# Patient Record
Sex: Male | Born: 1995 | Race: White | Hispanic: Yes | Marital: Single | Smoking: Current every day smoker
Health system: Southern US, Community
[De-identification: ages and names within clinical notes are randomized; demographics above are authoritative.]

---
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
## 2017-12-09 ENCOUNTER — Other Ambulatory Visit: Payer: Self-pay

## 2017-12-09 ENCOUNTER — Emergency Department (HOSPITAL_COMMUNITY)
Admission: EM | Admit: 2017-12-09 | Discharge: 2017-12-09 | Disposition: A | Discharge status: Home / Self Care | Payer: Self-pay | Attending: Neurology | Admitting: Neurology

## 2017-12-09 ENCOUNTER — Inpatient Hospital Stay
Admission: AD | Admit: 2017-12-09 | Discharge: 2017-12-12 | DRG: 885 | Disposition: A | Discharge status: Home / Self Care | Payer: No Typology Code available for payment source | Source: Intra-hospital | Attending: Psychiatry | Admitting: Psychiatry

## 2017-12-09 ENCOUNTER — Encounter (HOSPITAL_COMMUNITY): Payer: Self-pay | Admitting: Emergency Medicine

## 2017-12-09 DIAGNOSIS — R45851 Suicidal ideations: Secondary | ICD-10-CM | POA: Insufficient documentation

## 2017-12-09 DIAGNOSIS — F1210 Cannabis abuse, uncomplicated: Secondary | ICD-10-CM | POA: Diagnosis present

## 2017-12-09 DIAGNOSIS — T50902A Poisoning by unspecified drugs, medicaments and biological substances, intentional self-harm, initial encounter: Secondary | ICD-10-CM | POA: Insufficient documentation

## 2017-12-09 DIAGNOSIS — F17210 Nicotine dependence, cigarettes, uncomplicated: Secondary | ICD-10-CM | POA: Diagnosis present

## 2017-12-09 DIAGNOSIS — Z915 Personal history of self-harm: Secondary | ICD-10-CM | POA: Diagnosis not present

## 2017-12-09 DIAGNOSIS — F1520 Other stimulant dependence, uncomplicated: Secondary | ICD-10-CM | POA: Insufficient documentation

## 2017-12-09 DIAGNOSIS — F1420 Cocaine dependence, uncomplicated: Secondary | ICD-10-CM | POA: Diagnosis not present

## 2017-12-09 DIAGNOSIS — F1510 Other stimulant abuse, uncomplicated: Secondary | ICD-10-CM | POA: Diagnosis present

## 2017-12-09 DIAGNOSIS — F329 Major depressive disorder, single episode, unspecified: Secondary | ICD-10-CM | POA: Diagnosis present

## 2017-12-09 DIAGNOSIS — Z813 Family history of other psychoactive substance abuse and dependence: Secondary | ICD-10-CM

## 2017-12-09 DIAGNOSIS — F322 Major depressive disorder, single episode, severe without psychotic features: Secondary | ICD-10-CM | POA: Insufficient documentation

## 2017-12-09 DIAGNOSIS — F332 Major depressive disorder, recurrent severe without psychotic features: Secondary | ICD-10-CM | POA: Diagnosis present

## 2017-12-09 DIAGNOSIS — F1410 Cocaine abuse, uncomplicated: Secondary | ICD-10-CM | POA: Diagnosis present

## 2017-12-09 LAB — COMPREHENSIVE METABOLIC PANEL
ALBUMIN: 4.6 g/dL (ref 3.5–5.0)
ALK PHOS: 72 U/L (ref 38–126)
ALT: 17 U/L (ref 17–63)
AST: 16 U/L (ref 15–41)
Anion gap: 7 (ref 5–15)
BILIRUBIN TOTAL: 1.9 mg/dL — AB (ref 0.3–1.2)
BUN: 10 mg/dL (ref 6–20)
CO2: 28 mmol/L (ref 22–32)
Calcium: 9.9 mg/dL (ref 8.9–10.3)
Chloride: 106 mmol/L (ref 101–111)
Creatinine, Ser: 1.13 mg/dL (ref 0.61–1.24)
GFR calc Af Amer: >60 mL/min (ref >60)
GFR calc non Af Amer: >60 mL/min (ref >60)
GLUCOSE: 91 mg/dL (ref 65–99)
POTASSIUM: 3.3 mmol/L — AB (ref 3.5–5.1)
SODIUM: 141 mmol/L (ref 135–145)
Total Protein: 7.9 g/dL (ref 6.5–8.1)

## 2017-12-09 LAB — RAPID URINE DRUG SCREEN, HOSP PERFORMED
Amphetamines: POSITIVE — AB
BENZODIAZEPINES: NOT DETECTED
Barbiturates: NOT DETECTED
Cocaine: POSITIVE — AB
Opiates: NOT DETECTED
TETRAHYDROCANNABINOL: NOT DETECTED

## 2017-12-09 LAB — CBC WITH DIFFERENTIAL/PLATELET
BASOS ABS: 0 10*3/uL (ref 0.0–0.1)
Basophils Relative: 0 %
EOS PCT: 3 %
Eosinophils Absolute: 0.3 10*3/uL (ref 0.0–0.7)
HEMATOCRIT: 45.6 % (ref 39.0–52.0)
Hemoglobin: 15.8 g/dL (ref 13.0–17.0)
LYMPHS PCT: 32 %
Lymphs Abs: 3 10*3/uL (ref 0.7–4.0)
MCH: 30.6 pg (ref 26.0–34.0)
MCHC: 34.6 g/dL (ref 30.0–36.0)
MCV: 88.2 fL (ref 78.0–100.0)
MONO ABS: 0.8 10*3/uL (ref 0.1–1.0)
MONOS PCT: 8 %
NEUTROS ABS: 5.4 10*3/uL (ref 1.7–7.7)
Neutrophils Relative %: 57 %
PLATELETS: 262 10*3/uL (ref 150–400)
RBC: 5.17 MIL/uL (ref 4.22–5.81)
RDW: 12.4 % (ref 11.5–15.5)
WBC: 9.5 10*3/uL (ref 4.0–10.5)

## 2017-12-09 LAB — URINALYSIS, ROUTINE W REFLEX MICROSCOPIC
Bilirubin Urine: NEGATIVE
GLUCOSE, UA: NEGATIVE mg/dL
HGB URINE DIPSTICK: NEGATIVE
Ketones, ur: NEGATIVE mg/dL
LEUKOCYTES UA: NEGATIVE
Nitrite: NEGATIVE
PH: 6 (ref 5.0–8.0)
PROTEIN: NEGATIVE mg/dL
SPECIFIC GRAVITY, URINE: 1.019 (ref 1.005–1.030)

## 2017-12-09 LAB — ACETAMINOPHEN LEVEL: Acetaminophen (Tylenol), Serum: <10 ug/mL — ABNORMAL LOW (ref 10–30)

## 2017-12-09 LAB — ETHANOL

## 2017-12-09 LAB — SALICYLATE LEVEL: Salicylate Lvl: <7 mg/dL (ref 2.8–30.0)

## 2017-12-09 MED ORDER — ALUM & MAG HYDROXIDE-SIMETH 200-200-20 MG/5ML PO SUSP: 30.0000 mL | ORAL | Status: DC | PRN

## 2017-12-09 MED ORDER — MAGNESIUM HYDROXIDE 400 MG/5ML PO SUSP: 30.0000 mL | Freq: Every day | ORAL | Status: DC | PRN

## 2017-12-09 MED ORDER — TRAZODONE HCL 50 MG PO TABS: 50.0000 mg | ORAL_TABLET | Freq: Every evening | ORAL | Status: DC | PRN

## 2017-12-09 MED ORDER — ACETAMINOPHEN 325 MG PO TABS: 650.0000 mg | ORAL_TABLET | Freq: Four times a day (QID) | ORAL | Status: DC | PRN

## 2017-12-09 MED ORDER — SODIUM CHLORIDE 0.9 % IV BOLUS
1000.0000 mL | Freq: Once | INTRAVENOUS | Status: AC
  Administered 2017-12-09: 1000 mL via INTRAVENOUS

## 2017-12-09 NOTE — ED Notes (Signed)
Patient is resting comfortably. 

## 2017-12-09 NOTE — ED Notes (Signed)
Called [facility] to cancel transport at this time.

## 2017-12-09 NOTE — ED Notes (Signed)
Called [facility] back for transfer to [place].
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
## 2017-12-09 NOTE — [facility] Suicide Risk Assessment (Signed)
[facility]   Nursing information obtained from:  Patient Demographic factors:  Male, Adolescent or young adult Current Mental Status:  NA Loss Factors:  NA Historical Factors:  NA Risk Reduction Factors:  Employed, Positive social support, Positive therapeutic relationship, Sense of responsibility to family  Total Time spent with patient: 1 hour Principal Problem: MDD (major depressive disorder), recurrent episode, severe ([facility]) Diagnosis:   Patient Active Problem List   Diagnosis Date Noted  . MDD (major depressive disorder) [F32.9] 12/09/2017  . MDD (major depressive disorder), recurrent episode, severe ([facility]) [F33.2] 12/09/2017  . Cocaine use disorder, moderate, dependence ([facility]) [F14.20] 12/09/2017  . 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 ALLERGIC RHINITIS, CHRONIC [J30.9] 03/08/2007   Subjective Data:  See H&P  Continued Clinical Symptoms:  Alcohol Use Disorder Identification Test Final Score (AUDIT): 4 The "Alcohol Use Disorders Identification Test", Guidelines for Use in Primary Care, Second Edition.  World Science writerHealth Organization [facility](WHO). Score between 0-7:  no or low risk or alcohol related problems. Score between 8-15:  moderate risk of alcohol related problems. Score between 16-19:  high risk of alcohol related problems. Score 20 or above:  warrants further diagnostic evaluation for alcohol dependence and treatment.   CLINICAL FACTORS:   Bipolar Disorder:   Bipolar II untreated   Musculoskeletal: Strength & Muscle Tone: within normal limits Gait & Station: normal Patient leans: N/A  Psychiatric Specialty Exam: See H&P   COGNITIVE FEATURES THAT CONTRIBUTE TO RISK:  Closed-mindedness    SUICIDE RISK:   Mild:  Suicidal ideation of limited frequency, intensity, duration, and specificity.  There are no identifiable plans, no associated intent, mild dysphoria and related symptoms, good self-control (both objective and  subjective assessment), few other risk factors, and identifiable protective factors, including available and accessible social support.  PLAN OF CARE: See H&P  I certify that inpatient services furnished can reasonably be expected to improve the patient's condition.   Chabely Norby, MD 12/09/2017, 6:40 PM

## 2017-12-09 NOTE — ED Triage Notes (Signed)
Pt in by [facility] from local [facility] after taking approx 32 Unisom tabs in an admitted suicide attempt per law enforcement.

## 2017-12-09 NOTE — ED Notes (Signed)
Vital signs stable. 

## 2017-12-09 NOTE — ED Notes (Signed)
TTS in room.  

## 2017-12-09 NOTE — Plan of Care (Signed)
New admission , has not discussed goals   Problem: Education: Goal: Knowledge of [facility] Education information/materials will improve Outcome: Not Progressing Goal: Emotional status will improve Outcome: Not Progressing Goal: Mental status will improve Outcome: Not Progressing Goal: Verbalization of understanding the information provided will improve Outcome: Not Progressing   Problem: Coping: Goal: Coping ability will improve Outcome: Not Progressing Goal: Will verbalize feelings Outcome: Not Progressing   Problem: Medication: Goal: Compliance with prescribed medication regimen will improve Outcome: Not Progressing   Problem: Self-Concept: Goal: Ability to disclose and discuss suicidal ideas will improve Outcome: Not Progressing Goal: Will verbalize positive feelings about self Outcome: Not Progressing

## 2017-12-09 NOTE — ED Triage Notes (Signed)
Poison Control contacted with the following recommendations: Cardiac monitoring, ekg, 4 hour tylenol, etoh, aspirin levels, electrolytes, magnesium levels now. Watch for prolonged qtc, drowsiness, possible anticholinergic effects such as tachycardia, hypertension, agitation, possible hallucinations.   Monitor pt for at least 6 hours post ingestion or back to baseline., hydrate, benzo's as needed

## 2017-12-09 NOTE — ED Notes (Signed)
Pt given breakfast tray

## 2017-12-09 NOTE — BH Assessment (Addendum)
Tele Assessment Note   Patient Name: Logan Christensen MRN: [id number] Referring Physician: Geoffery Lyons, MD Location of Patient: [facility] Location of Provider: Behavioral Health TTS Department  Logan Christensen is an 22 y.o. male who presents to the ED voluntarily. Pt reports to this writer that he intentionally ingested approx 32 Unisom tabs in a suicide attempt. TTS asked the pt to identify the stressors leading to his suicide attempt and he refused. Pt stated "I just don't want to to talk about it, just a lot of stuff." Pt labs are positive for cocaine and amphetamines. Pt admits to using the substance and states he also uses marijuana. Pt states this is the first time he has ever actually attempted suicide. Pt does not have a current provider.   Pt states he lives with his foster parents. Pt is falling asleep throughout the assessment. TTS has to call the pt's name multiple times in order to keep him engaged in the assessment.   TTS consulted with Donell Sievert, PA who recommends inpt treatment. Herbert Seta, RN and EDP Geoffery Lyons, MD have been advised of the disposition.  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  Family History: No family history on file.  Social History:  has no tobacco, alcohol, and drug history on file.  Additional Social History:  Alcohol / Drug Use Pain Medications: See MAR Prescriptions: See MAR Over the Counter: See MAR History of alcohol / drug use?: Yes Substance #1 Name of Substance 1: Cannabis 1 - Age of First Use: 20 1 - Amount (size/oz): varies 1 - Frequency: occasional 1 - Duration: ongoing 1 - Last Use / Amount: pt says 3 weeks ago Substance #2 Name of Substance 2: Cocaine 2 - Age of First Use: 20 2 - Amount (size/oz): varies 2 - Frequency: occasional 2 - Duration: ongoing 2 - Last  Use / Amount: pt says 3 weeks ago, labs positive on arrival to ED Substance #3 Name of Substance 3: Amphetamines 3 - Age of First Use: 20 3 - Amount (size/oz): varies 3 - Frequency: occasional 3 - Duration: ongoing 3 - Last Use / Amount: pt says 3 weeks ago, labs positive on arrival to ED  CIWA: CIWA-Ar BP: 118/71 Pulse Rate: 70 COWS:    Allergies: Allergies not on file  Home Medications:  (Not in a hospital admission)  OB/GYN Status:  No LMP for male patient.  General Assessment Data Location of Assessment: [facility] TTS Assessment: In system Is this a Tele or Face-to-Face Assessment?: Tele Assessment Is this an Initial Assessment or a Re-assessment for this encounter?: Initial Assessment Marital status: Single Is patient pregnant?: No Pregnancy Status: No Living Arrangements: Parent Can pt return to current living arrangement?: Yes Admission Status: Voluntary Is patient capable of signing voluntary admission?: Yes Referral Source: Self/Family/Friend Insurance type: none     Crisis Care Plan Living Arrangements: Parent Name of Psychiatrist: none at present Name of Therapist: none at present   Education Status Is patient currently in school?: No Is the patient employed, unemployed or receiving disability?: Employed  Risk to self with the past 6 months Suicidal Ideation: Yes-Currently Present Has patient been a risk to self within the past 6 months prior to admission? : Yes Suicidal Intent: Yes-Currently Present Has patient had any suicidal intent within the past 6 months prior to admission? : Yes Is patient  at risk for suicide?: Yes Suicidal Plan?: Yes-Currently Present Has patient had any suicidal plan within the past 6 months prior to admission? : Yes Specify Current Suicidal Plan:  approx 32 Unisom tabs in an admitted suicide attempt  Access to Means: Yes Specify Access to Suicidal Means: pt has access to medications  What has been your use of drugs/alcohol  within the last 12 months?: admits to cocaine, cannabis, and meth use  Previous Attempts/Gestures: No Triggers for Past Attempts: None known Intentional Self Injurious Behavior: None Family Suicide History: No Recent stressful life event(s): Other (Comment)(pt refuses to disclose) Persecutory voices/beliefs?: No Depression: Yes Depression Symptoms: Feeling worthless/self pity, Fatigue, Despondent Substance abuse history and/or treatment for substance abuse?: Yes Suicide prevention information given to non-admitted patients: Not applicable  Risk to Others within the past 6 months Homicidal Ideation: No Does patient have any lifetime risk of violence toward others beyond the six months prior to admission? : No Thoughts of Harm to Others: No Current Homicidal Intent: No Current Homicidal Plan: No Access to Homicidal Means: No History of harm to others?: No Assessment of Violence: None Noted Does patient have access to weapons?: No Criminal Charges Pending?: No Does patient have a court date: No Is patient on probation?: No  Psychosis Hallucinations: None noted Delusions: None noted  Mental Status Report Appearance/Hygiene: In scrubs Eye Contact: Poor Motor Activity: Freedom of movement Speech: Slurred, Slow Level of Consciousness: Drowsy Mood: Depressed, Despair Affect: Depressed, Flat Anxiety Level: None Thought Processes: Coherent, Relevant Judgement: Impaired Orientation: Place, Person, Time, Appropriate for developmental age, Situation Obsessive Compulsive Thoughts/Behaviors: None  Cognitive Functioning Concentration: Normal Memory: Remote Intact, Recent Intact Is patient IDD: No Is patient DD?: No Insight: Poor Impulse Control: Poor Appetite: Good Have you had any weight changes? : No Change Sleep: No Change Total Hours of Sleep: 8 Vegetative Symptoms: None  ADLScreening [facility] Assessment Services) Patient's cognitive ability adequate to safely complete daily  activities?: Yes Patient able to express need for assistance with ADLs?: Yes Independently performs ADLs?: Yes (appropriate for developmental age)  Prior Inpatient Therapy Prior Inpatient Therapy: No  Prior Outpatient Therapy Prior Outpatient Therapy: Yes Prior Therapy Dates: 2010 Prior Therapy Facilty/Provider(s): [facility](Rodenbough, Aram CandelaJohn R, PsyD ) Reason for Treatment: MED MANAGEMENT  Does patient have an ACCT team?: No Does patient have Intensive In-House Services?  : No Does patient have [facility] services? : No Does patient have P4CC services?: No  ADL Screening (condition at time of admission) Patient's cognitive ability adequate to safely complete daily activities?: Yes Is the patient deaf or have difficulty hearing?: No Does the patient have difficulty seeing, even when wearing glasses/contacts?: No Does the patient have difficulty concentrating, remembering, or making decisions?: No Patient able to express need for assistance with ADLs?: Yes Does the patient have difficulty dressing or bathing?: No Independently performs ADLs?: Yes (appropriate for developmental age) Does the patient have difficulty walking or climbing stairs?: No Weakness of Legs: None Weakness of Arms/Hands: None  Home Assistive Devices/Equipment Home Assistive Devices/Equipment: None    Abuse/Neglect Assessment (Assessment to be complete while patient is alone) Abuse/Neglect Assessment Can Be Completed: Yes Physical Abuse: Denies Verbal Abuse: Denies Sexual Abuse: Denies Exploitation of patient/patient's resources: Denies Self-Neglect: Denies     Merchant navy officerAdvance Directives (For Healthcare) Does Patient Have a Medical Advance Directive?: No Would patient like information on creating a medical advance directive?: No - Patient declined    Additional Information 1:1 In Past 12 Months?: No CIRT  Risk: No Elopement Risk: No Does patient have medical  clearance?: Yes     Disposition: TTS consulted with Donell Sievert, PA who recommends inpt treatment. Herbert Seta, RN and EDP Geoffery Lyons, MD have been advised of the disposition. Disposition Initial Assessment Completed for this Encounter: Yes Disposition of Patient: Admit Type of inpatient treatment program: Adult(per Donell Sievert, PA) Patient refused recommended treatment: No  This service was provided via telemedicine using a 2-way, interactive audio and video technology.  Names of all persons participating in this telemedicine service and their role in this encounter. Name: Logan Christensen Viann Fish Role: Patient  Name: Princess Bruins Role: Patient           Karolee Ohs 12/09/2017 6:21 AM

## 2017-12-09 NOTE — Progress Notes (Signed)
Admission Note:  D: Pt appeared depressed  With  a flat affect.  Pt  denies SI / AVH at this time. 22 year old male patient in under the service of Dr. He at this time to be transferred to Dr. Johnella MoloneyMcNew on Monday . patient went into [employer]  Brought 30 unisom tablets  Took them  When he began to feel bad , he became afraid [facility] [employer] and [place] [facility].   Speech as fast and  Pressured .  Writer had difficult time  Following  Patient, not a good historian.  Pt is redirectable and cooperative with assessment.      A: Pt admitted to unit per protocol, skin assessment and search done and no contraband found with MHT  .  Pt  educated on therapeutic milieu rules. Pt was introduced to milieu by nursing staff.    R: Pt was receptive to education about the milieu .  15 min safety checks started. Clinical research associatewriter offered support

## 2017-12-09 NOTE — BHH Group Notes (Signed)
B[facility] Group Notes:  (Nursing/MHT/Case Management/Adjunct)  Date:  12/09/2017  Time:  9:08 PM  Type of Therapy:  Group Therapy  Participation Level:  Did Not Attend    Logan NeighborsKeith D Christensen Sine 12/09/2017, 9:08 PM

## 2017-12-09 NOTE — Progress Notes (Signed)
TTS consulted with Donell SievertSpencer Simon, PA who recommends inpt treatment. Herbert SetaHeather, RN and EDP Geoffery Lyonselo, Douglas, MD have been advised of the disposition. Pt is currently VOL. If pt attempts to elope, [facility] recommends [facility] due to the suicide attempt.  Princess BruinsAquicha Brenae Lasecki, MSW, LCSW Therapeutic Triage Specialist  [phone]

## 2017-12-09 NOTE — ED Notes (Signed)
Report given to Ivette LoyalGwynn, RN at [facility].

## 2017-12-09 NOTE — ED Provider Notes (Signed)
[facility] EMERGENCY DEPARTMENT Provider Note   CSN: [id number] Arrival date & time: 12/09/17  0019     History   Chief Complaint Chief Complaint  Patient presents with  . V70.1    overdose    HPI Logan Christensen is a 22 y.o. male.  Patient is a 22 year old male with no significant past medical history.  He was brought by EMS this evening after taking roughly 30 Unisom tablets this evening.  From what I am told, he purchased these medications from [place], then went to his car and took them.  When he began to feel poorly, he returned to the store and had them call 911.  He told EMS he took these medications to try to harm himself.  He is somewhat difficult to get a history from as he does appear drowsy and somewhat confused.  The history is provided by the patient.    No past medical history on file.  Patient Active Problem List   Diagnosis Date Noted  . ALLERGIC RHINITIS, CHRONIC 03/08/2007          Home Medications    Prior to Admission medications   Not on File    Family History No family history on file.  Social History Social History   Tobacco Use  . Smoking status: Not on file  Substance Use Topics  . Alcohol use: Not on file  . Drug use: Not on file     Allergies   Patient has no allergy information on record.   Review of Systems Review of Systems  All other systems reviewed and are negative.    Physical Exam Updated Vital Signs BP 100/74   Pulse 91   Temp 98 F (36.7 C)   Ht 5\' 11"  (1.803 m)   Wt 71.6 kg (157 lb 14.4 oz)   SpO2 100%   BMI 22.02 kg/m   Physical Exam  Constitutional: He is oriented to person, place, and time. He appears well-developed and well-nourished. No distress.  HENT:  Head: Normocephalic and atraumatic.  Mouth/Throat: Oropharynx is clear and moist.  Neck: Normal range of motion. Neck supple.  Cardiovascular: Normal rate and regular rhythm. Exam reveals no friction rub.  No murmur  heard. Pulmonary/Chest: Effort normal and breath sounds normal. No respiratory distress. He has no wheezes. He has no rales.  Abdominal: Soft. Bowel sounds are normal. He exhibits no distension. There is no tenderness.  Musculoskeletal: Normal range of motion. He exhibits no edema.  Neurological: He is alert and oriented to person, place, and time. No cranial nerve deficit. He exhibits normal muscle tone. Coordination normal.  Skin: Skin is warm and dry. He is not diaphoretic.  Nursing note and vitals reviewed.    ED Treatments / Results  Labs (all labs ordered are listed, but only abnormal results are displayed) Labs Reviewed  COMPREHENSIVE METABOLIC PANEL  CBC WITH DIFFERENTIAL/PLATELET  ACETAMINOPHEN LEVEL  SALICYLATE LEVEL  ETHANOL  URINALYSIS, ROUTINE W REFLEX MICROSCOPIC  RAPID URINE DRUG SCREEN, HOSP PERFORMED    ED ECG REPORT   Date: 12/09/2017  Rate: 77  Rhythm: normal sinus rhythm  QRS Axis: normal  Intervals: normal  ST/T Wave abnormalities: normal  Conduction Disutrbances:none  Narrative Interpretation:   Old EKG Reviewed: none available  I have personally reviewed the EKG tracing and agree with the computerized printout as noted.   Radiology No results found.  Procedures Procedures (including critical care time)  Medications Ordered in ED Medications  sodium chloride 0.9 %  bolus 1,000 mL (has no administration in time range)     Initial Impression / Assessment and Plan / ED Course  I have reviewed the triage vital signs and the nursing notes.  Pertinent labs & imaging results that were available during my care of the patient were reviewed by me and considered in my medical decision making (see chart for details).  Patient is a 22 year old male brought after overdosing on Unisom.  He states that he did this as an attempt to harm himself.  Poison control was consulted and recommendations followed.  He was hydrated and laboratory studies obtained.  EKG  shows no QT prolongation or other abnormality.  Tylenol level at 4 hours was undetectable.  He has been observed for 6 hours and is medically cleared.  He is also been evaluated by TTS who feels as though inpatient treatment is appropriate.  Bed assignment is pending.  Final Clinical Impressions(s) / ED Diagnoses   Final diagnoses:  None    ED Discharge Orders    None       Geoffery Lyonselo, Burhan Barham, MD 12/09/17 [phone]

## 2017-12-09 NOTE — BHH Counselor (Addendum)
Patient has been accepted to [facility].  Accepting physician is Dr. He.  Attending Physician will be Dr. Johnella MoloneyMcNew.  Patient has been assigned to room 311-A, by [facility] Charge Nurse [place].  Call report to [phone].  Representative/Transfer Coordinator is Wilmon ArmsShaleta Stevenson (TTS [phone]) Patient pre-admitted by [facility] Patient Access   **Per [facility] BM[facility] Charge RN, pt can be admitted after 7pm due to staffing.  [facility] ER Staff (ED Charge RN) made aware of acceptance.

## 2017-12-09 NOTE — ED Notes (Signed)
Spoke with poison control who stated pt is now medically cleared.

## 2017-12-09 NOTE — Progress Notes (Signed)
Patient meets criteria for inpatient treatment. [facility] CSW faxed referrals to the following facilities for review.  [facility]   TTS will continue to seek bed placement.     Moss McKy-sha Shermeka Rutt, MSW, LCSW-A, LCAS-A 12/09/2017 8:44 AM

## 2017-12-09 NOTE — ED Notes (Addendum)
Called [facility] for transport to [facility].

## 2017-12-10 MED ORDER — NICOTINE 21 MG/24HR TD PT24
21.0000 mg | MEDICATED_PATCH | Freq: Every day | TRANSDERMAL | Status: DC
  Administered 2017-12-10 – 2017-12-11 (×2): 21 mg via TRANSDERMAL
  Filled 2017-12-10 (×3): qty 1

## 2017-12-10 NOTE — BHH Counselor (Signed)
Adult Comprehensive Assessment  Patient ID: Logan EpleyDarrin M Christensen, male   DOB: 08/04/1995, 22 y.o.   MRN: [id number]  Information Source: Information source: Patient  Current Stressors:  Patient states their primary concerns and needs for treatment are:: "I was trying to commit suicide" Patient states their goals for this hospitilization and ongoing recovery are:: "I dont have any goals because I know what I did was a mistake" Educational / Learning stressors: none reported Employment / Job issues: none reported Family Relationships: "complicatedEngineer, petroleum" Financial / Lack of resources (include bankruptcy): emplyoment and [employer] disability Housing / Lack of housing: residing with foster parents Physical health (include injuries & life threatening diseases): none reported Social relationships: "good" Substance abuse: cocaine, Adderall, ETOH Bereavement / Loss: none reported  Living/Environment/Situation:  Living Arrangements: Other relatives(Foster parents) Who else lives in the home?: foster parents and 2 foster kids How long has patient lived in current situation?: "about 1 month" What is atmosphere in current home: Comfortable, Temporary  Family History:  Marital status: Single Are you sexually active?: Yes What is your sexual orientation?: heterosexual Has your sexual activity been affected by drugs, alcohol, medication, or emotional stress?: no Does patient have children?: No  Childhood History:  By whom was/is the patient raised?: Foster parents, Adoptive parents Additional childhood history information: pt reports he lived with his biological mom until he was 89 years old, then his foster parent until he was 11yo, and later on adopted by a different family Description of patient's relationship with caregiver when they were a child: "good" Patient's description of current relationship with people who raised him/her: "good" How were you disciplined when you got in trouble as a child/adolescent?:  "anything imaginable" Does patient have siblings?: Yes Number of Siblings: 4 Description of patient's current relationship with siblings: patient reports he has 3 biological siblings and 1 adoptive siblings, he described their relationship as "good" Did patient suffer any verbal/emotional/physical/sexual abuse as a child?: No Did patient suffer from severe childhood neglect?: No Has patient ever been sexually abused/assaulted/raped as an adolescent or adult?: No Was the patient ever a victim of a crime or a disaster?: No Witnessed domestic violence?: No Has patient been effected by domestic violence as an adult?: No  Education:  Highest grade of school patient has completed: 12th Currently a student?: No Learning disability?: No  Employment/Work Situation:   Employment situation: Employed(pt reports he is employed and receives VA disability) Where is patient currently employed?: [employer] How long has patient been employed?: 1.5 years Why is patient on disability: pt indicated he served three years in [employer] How long has patient been on disability: pt did not provide information Patient's job has been impacted by current illness: No What is the longest time patient has a held a job?: 3.5 years Where was the patient employed at that time?: [employer] Did You Receive Any Psychiatric Treatment/Services While in [employer]?: No Are There Guns or Other Weapons in Your Home?: No  Financial Resources:   Financial resources: Income from employment  Alcohol/Substance Abuse:   What has been your use of drugs/alcohol within the last 12 months?: Cocaine - once per month 1/2 gram or more, Adderall once monthly 3to 4 pills, ETOH- occassionally If attempted suicide, did drugs/alcohol play a role in this?: No Alcohol/Substance Abuse Treatment Hx: Denies past history Has alcohol/substance abuse ever caused legal problems?: No  Social Support System:   Patient's Community Support  System: Good Describe Community Support System: brother, friend "Eddie"  Type of faith/religion: "I don't have one" How does patient's faith help to cope with current illness?: n/a  Leisure/Recreation:   Leisure and Hobbies: hang out with friends  Strengths/Needs:   What is the patient's perception of their strengths?: "talking ability" Patient states they can use these personal strengths during their treatment to contribute to their recovery: "I am not sure" Patient states these barriers may affect/interfere with their treatment: "I dont see any" Patient states these barriers may affect their return to the community: "I don't see any"   Discharge Plan:   Currently receiving community mental health services: No Patient states concerns and preferences for aftercare planning are: Pt reports that he receives medical services at the [facility] in [place] and would go there for any mental health services as well Patient states they will know when they are safe and ready for discharge when: "Basically I'm not going to hurt myself or hang out with the wrong crowd" Does patient have access to transportation?: Yes(foster parents will pcik him up) Does patient have financial barriers related to discharge medications?: No Will patient be returning to same living situation after discharge?: Yes(pt will return to foster parent's home)  Summary/Recommendations:   Summary and Recommendations (to be completed by the evaluator): Patient is a 22 year old male admitted involuntarily due to a suicide attempt and diagnosed with Major Depressive Disorder and Substance Abuse Disorder. Patient has a childhood diagnosis of bipolar disorder, but no previous treatment, presented in [facility] ER after he admitted taking 32 Unison tabs as a suicidal attempt. On arrival to the ER, he is also positive for other substances. Patient will benefit from crisis stabilization, medication evaluation, group therapy and psychoeducation.  In addition to case management for discharge planning. At discharge it is recommended that patient adhere to the established discharge plan and continue treatment.   Lyndell Gillyard  CUEBAS-COLON. 12/10/2017

## 2017-12-10 NOTE — Progress Notes (Signed)
D- Patient alert and oriented. Patient presents in a pleasant mood on assessment stating that he slept "good" last night with no complaints at this time. Patient denies SI, HI, AVH, and pain at this time. Patient also denies any signs/symptom of depression/anxiety at this time. Patient's goal for today is "trying to stay awake more today".  A- Support and encouragement provided.  Routine safety checks conducted every 15 minutes.  Patient informed to notify staff with problems or concerns.  R- Patient contracts for safety at this time. Patient compliant with treatment plan. Patient receptive, calm, and cooperative. Patient interacts well with others on the unit.  Patient remains safe at this time.

## 2017-12-10 NOTE — BHH Suicide Risk Assessment (Signed)
B[facility] INPATIENT:  Family/Significant Other Suicide Prevention Education  Suicide Prevention Education:  Patient Refusal for Family/Significant Other Suicide Prevention Education: The patient Logan Christensen has refused to provide written consent for family/significant other to be provided Family/Significant Other Suicide Prevention Education during admission and/or prior to discharge.  Physician notified.  Hiroto Saltzman  CUEBAS-COLON 12/10/2017, 4:52 PM

## 2017-12-10 NOTE — Progress Notes (Signed)
[facility] MD Progress Note  12/10/2017 12:55 PM Logan Christensen  MRN:  [id number] Subjective:  Logan Christensen is calm, cooperative, very pleasant this morning. Logan Christensen speech is much clearer as well.  Logan Christensen reports feeing better and had a good night sleep. Denied SI. No AVH.   While Logan Christensen admitted that Logan Christensen is impulsive at times, but denied depression and declined medication.  Logan Christensen is agreeable to outpatient Mental health care and substance abuse treatment.   Principal Problem: MDD (major depressive disorder), recurrent episode, severe ([place]) Diagnosis:   Patient Active Problem List   Diagnosis Date Noted  . MDD (major depressive disorder) [F32.9] 12/09/2017  . MDD (major depressive disorder), recurrent episode, severe ([place]) [F33.2] 12/09/2017  . Cocaine use disorder, moderate, dependence ([place]) [F14.20] 12/09/2017  . 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 ALLERGIC RHINITIS, CHRONIC [J30.9] 03/08/2007   Total Time spent with patient: 20 minutes  Past Psychiatric History: Hx of bipolar, dx at age 53. Pt stated that Logan Christensen was just angry.  Past Medical History: History reviewed. No pertinent past medical history. History reviewed. No pertinent surgical history. Family History: History reviewed. No pertinent family history. Family Psychiatric  History:  unknown Social History:  Social History   Substance and Sexual Activity  Alcohol Use Not on file     Social History   Substance and Sexual Activity  Drug Use Not on file    Social History   Socioeconomic History  . Marital status: Single    Spouse name: Not on file  . Number of children: Not on file  . Years of education: Not on file  . Highest education level: Not on file  Occupational History  . Not on file  Social Needs  . Financial resource strain: Not on file  . Food insecurity:    Worry: Not on file    Inability: Not on file  . Transportation needs:    Medical: Not on file    Non-medical: Not on file  Tobacco Use  . Smoking  status: Current Every Day Smoker    Packs/day: 1.00  . Smokeless tobacco: Current User  Substance and Sexual Activity  . Alcohol use: Not on file  . Drug use: Not on file  . Sexual activity: Not on file  Lifestyle  . Physical activity:    Days per week: Not on file    Minutes per session: Not on file  . Stress: Not on file  Relationships  . Social connections:    Talks on phone: Not on file    Gets together: Not on file    Attends religious service: Not on file    Active member of club or organization: Not on file    Attends meetings of clubs or organizations: Not on file    Relationship status: Not on file  Other Topics Concern  . Not on file  Social History Narrative  . Not on file   Additional Social History: biological mom was a drug addict, but no details available.     Sleep: Good  Appetite:  Good  Current Medications: Current Facility-Administered Medications  Medication Dose Route Frequency Provider Last Rate Last Dose  . acetaminophen (TYLENOL) tablet 650 mg  650 mg Oral Q6H PRN Logan Noon, MD      . alum & mag hydroxide-simeth (MAALOX/MYLANTA) 200-200-20 MG/5ML suspension 30 mL  30 mL Oral Q4H PRN Logan Pop, MD      . magnesium hydroxide (MILK OF MAGNESIA) suspension 30 mL  30  mL Oral Daily PRN Logan Adamson, MD      . traZODone (DESYREL) tablet 50 mg  50 mg Oral QHS PRN Logan Pearse, MD        Lab Results:  Results for orders placed or performed during the hospital encounter of 12/09/17 (from the past 48 hour(s))  Urinalysis, Routine w reflex microscopic     Status: None   Collection Time: 12/09/17 12:44 AM  Result Value Ref Range   Color, Urine YELLOW YELLOW   APPearance CLEAR CLEAR   Specific Gravity, Urine 1.019 1.005 - 1.030   pH 6.0 5.0 - 8.0   Glucose, UA NEGATIVE NEGATIVE mg/dL   Hgb urine dipstick NEGATIVE NEGATIVE   Bilirubin Urine NEGATIVE NEGATIVE   Ketones, ur NEGATIVE NEGATIVE mg/dL   Protein, ur NEGATIVE NEGATIVE mg/dL   Nitrite NEGATIVE NEGATIVE    Leukocytes, UA NEGATIVE NEGATIVE    Comment: Performed at [facility], [address], [place], [place] [address]  Urine rapid drug screen (hosp performed)     Status: Abnormal   Collection Time: 12/09/17 12:44 AM  Result Value Ref Range   Opiates NONE DETECTED NONE DETECTED   Cocaine POSITIVE (A) NONE DETECTED   Benzodiazepines NONE DETECTED NONE DETECTED   Amphetamines POSITIVE (A) NONE DETECTED   Tetrahydrocannabinol NONE DETECTED NONE DETECTED   Barbiturates NONE DETECTED NONE DETECTED    Comment: (NOTE) DRUG SCREEN FOR MEDICAL PURPOSES ONLY.  IF CONFIRMATION IS NEEDED FOR ANY PURPOSE, NOTIFY LAB WITHIN 5 DAYS. LOWEST DETECTABLE LIMITS FOR URINE DRUG SCREEN Drug Class                     Cutoff (ng/mL) Amphetamine and metabolites    1000 Barbiturate and metabolites    200 Benzodiazepine                 706 Tricyclics and metabolites     300 Opiates and metabolites        300 Cocaine and metabolites        300 THC                            50 Performed at [facility], [address], [place], [place] [address]   Comprehensive metabolic panel     Status: Abnormal   Collection Time: 12/09/17 12:48 AM  Result Value Ref Range   Sodium 141 135 - 145 mmol/L   Potassium 3.3 (L) 3.5 - 5.1 mmol/L   Chloride 106 101 - 111 mmol/L   CO2 28 22 - 32 mmol/L   Glucose, Bld 91 65 - 99 mg/dL   BUN 10 6 - 20 mg/dL   Creatinine, Ser 1.13 0.61 - 1.24 mg/dL   Calcium 9.9 8.9 - 10.3 mg/dL   Total Protein 7.9 6.5 - 8.1 g/dL   Albumin 4.6 3.5 - 5.0 g/dL   AST 16 15 - 41 U/L   ALT 17 17 - 63 U/L   Alkaline Phosphatase 72 38 - 126 U/L   Total Bilirubin 1.9 (H) 0.3 - 1.2 mg/dL   GFR calc non Af Amer >60 >60 mL/min   GFR calc Af Amer >60 >60 mL/min    Comment: (NOTE) The eGFR has been calculated using the CKD EPI equation. This calculation has not been validated in all clinical situations. eGFR's persistently <60 mL/min signify possible Chronic Kidney Disease.    Anion gap 7 5 - 15     Comment: Performed  at [facility], [address] [place], [place] [address]  CBC with Differential     Status: None   Collection Time: 12/09/17 12:48 AM  Result Value Ref Range   WBC 9.5 4.0 - 10.5 K/uL   RBC 5.17 4.22 - 5.81 MIL/uL   Hemoglobin 15.8 13.0 - 17.0 g/dL   HCT 45.6 39.0 - 52.0 %   MCV 88.2 78.0 - 100.0 fL   MCH 30.6 26.0 - 34.0 pg   MCHC 34.6 30.0 - 36.0 g/dL   RDW 12.4 11.5 - 15.5 %   Platelets 262 150 - 400 K/uL   Neutrophils Relative % 57 %   Neutro Abs 5.4 1.7 - 7.7 K/uL   Lymphocytes Relative 32 %   Lymphs Abs 3.0 0.7 - 4.0 K/uL   Monocytes Relative 8 %   Monocytes Absolute 0.8 0.1 - 1.0 K/uL   Eosinophils Relative 3 %   Eosinophils Absolute 0.3 0.0 - 0.7 K/uL   Basophils Relative 0 %   Basophils Absolute 0.0 0.0 - 0.1 K/uL    Comment: Performed at [facility], [address] [place], [place] [address]  Acetaminophen level     Status: Abnormal   Collection Time: 12/09/17 12:48 AM  Result Value Ref Range   Acetaminophen (Tylenol), Serum <10 (L) 10 - 30 ug/mL    Comment: (NOTE) Therapeutic concentrations vary significantly. A range of 10-30 ug/mL  may be an effective concentration for many patients. However, some  are best treated at concentrations outside of this range. Acetaminophen concentrations >150 ug/mL at 4 hours after ingestion  and >50 ug/mL at 12 hours after ingestion are often associated with  toxic reactions. Performed at [facility], [address] [place], [place] [address]   Salicylate level     Status: None   Collection Time: 12/09/17 12:48 AM  Result Value Ref Range   Salicylate Lvl <8.5 2.8 - 30.0 mg/dL    Comment: Performed at [facility], [address], [place], [place] [address]  Ethanol     Status: None   Collection Time: 12/09/17 12:48 AM  Result Value Ref Range   Alcohol, Ethyl (B) <10 <10 mg/dL    Comment: (NOTE) Lowest detectable limit for serum alcohol is 10 mg/dL. For medical purposes only. Performed at [facility], [address], [place], [place] [address]   Acetaminophen level     Status: Abnormal   Collection Time: 12/09/17  3:07 AM  Result Value Ref Range   Acetaminophen (Tylenol), Serum <10 (L) 10 - 30 ug/mL    Comment: (NOTE) Therapeutic concentrations vary significantly. A range of 10-30 ug/mL  may be an effective concentration for many patients. However, some  are best treated at concentrations outside of this range. Acetaminophen concentrations >150 ug/mL at 4 hours after ingestion  and >50 ug/mL at 12 hours after ingestion are often associated with  toxic reactions. Performed at [facility], [address], [place], [place] [address]     Blood Alcohol level:  Lab Results  Component Value Date   ETH <10 67/20/9470    Metabolic Disorder Labs: No results found for: HGBA1C, MPG No results found for: PROLACTIN No results found for: CHOL, TRIG, HDL, CHOLHDL, VLDL, LDLCALC  Physical Findings: AIMS:  , ,  ,  ,    CIWA:    COWS:     Musculoskeletal: Strength & Muscle Tone: within normal limits Gait & Station: normal Patient leans: N/A  Psychiatric Specialty Exam: Physical Exam  ROS  Blood pressure 111/73, pulse 84, temperature 97.6 F (36.4 C), temperature source Oral, resp. rate 16, height _0  (1.803 m), weight 72.6 kg (160 lb), SpO2 100 %.Body mass index is 22.32 kg/m.  General Appearance: Casual and Fairly Groomed  Eye Contact:  Good  Speech:  Clear and Coherent and Normal Rate  Volume:  Normal  Mood:  Euthymic  Affect:  Appropriate and Congruent  Thought Process:  Goal Directed and Linear  Orientation:  Full (Time, Place, and Person)  Thought Content:  Logical  Suicidal Thoughts:  No  Homicidal Thoughts:  No  Memory:  Immediate;   Fair Recent;   Fair Remote;   Fair  Judgement:  Fair  Insight:  Fair  Psychomotor Activity:  Normal  Concentration:  Concentration: Fair and Attention Span: Fair  Recall:  [employer] of Knowledge:  Fair  Language:  Fair   Akathisia:  No    AIMS (if indicated):     Assets:  Communication Skills Desire for Improvement Physical Health Resilience Social Support  ADL's:  Intact  Cognition:  WNL  Sleep:  Number of Hours: 8.25     Treatment Plan Summary: Daily contact with patient to assess and evaluate symptoms and progress in treatment and Medication management   Pt is a 22yo M with likely undiagnosed mood disorder and substance abuse (cocaine, amphetamine and cannabis), presented in ED after Wess Baney took an overdose of Unisom as a suicidal attempt.  It was his very first suicidal attempt out of frustration and anger, when Khrista Braun felt that his friends betrayed him.  Emmaline Wahba seems to some behavioral issues at his adopted parents' home, but seems to get along a bit better with his foster parents.  Jlee Harkless works as Administrator. Given his impulsivity, substance abuse and mood dysregulation, Sueann Brownley would not be a safe driver, and would be at risks for self-harm again without adequate treatment. Mcdonald Reiling denied active SI now, but will continue IVC for safety and treatment.   Suicidal Attempt: improving -- continue IVC -- Nila Winker denied active SI now.   Mood disorder, r/o biplar II -- recommend a mood stabilizer, but pt declined again. Can continue to monitor his mood on the unit. -- provide trazodone 40m qhs prn.    Cocaine and Amphetamine Abuse -- recommend substance abuse treatment, but pt declined at this time. Will continue to encourage him.   Dispo -- recommend substance abuse IOP and outpatient Mental health after stabilized.    Avika Carbine, MD 12/10/2017, 12:55 PM

## 2017-12-10 NOTE — Progress Notes (Signed)
Patient alert no distress noted, affect is flat but brightens upon approach no distress noted, thoughts are organized, 15 minutes safety checks maintained, will continue to monitor.

## 2017-12-10 NOTE — [facility] Group Notes (Signed)
LCSW Group Therapy Note 12/10/2017 1:15pm  Type of Therapy and Topic: Group Therapy: Feelings Around Returning Home & Establishing a Supportive Framework and Supporting Oneself When Supports Not Available  Participation Level: Did Not Attend  Description of Group:  Patients first processed thoughts and feelings about upcoming discharge. These included fears of upcoming changes, lack of change, new living environments, judgements and expectations from others and overall stigma of mental health issues. The group then discussed the definition of a supportive framework, what that looks and feels like, and how do to discern it from an unhealthy non-supportive network. The group identified different types of supports as well as what to do when your family/friends are less than helpful or unavailable  Therapeutic Goals  1. Patient will identify one healthy supportive network that they can use at discharge. 2. Patient will identify one factor of a supportive framework and how to tell it from an unhealthy network. 3. Patient able to identify one coping skill to use when they do not have positive supports from others. 4. Patient will demonstrate ability to communicate their needs through discussion and/or role plays.  Summary of Patient Progress:  Pt was invited to attend group but chose not to attend. CSW will continue to encourage pt to attend group throughout their admission.   Therapeutic Modalities Cognitive Behavioral Therapy Motivational Interviewing   Kevyn Boquet  CUEBAS-COLON, LCSW 12/10/2017 9:13 AM 

## 2017-12-10 NOTE — [facility] Group Notes (Signed)
[facility] Group Notes:  (Nursing/MHT/Case Management/Adjunct)  Date:  12/10/2017  Time:  9:25 PM  Type of Therapy:  Group Therapy  Participation Level:  Active  Participation Quality:  Appropriate  Affect:  Appropriate  Cognitive:  Alert  Insight:  Good  Engagement in Group:  Engaged  Modes of Intervention:  Support  Summary of Progress/Problems:  Mayra NeerJackie L Armas Mcbee 12/10/2017, 9:25 PM

## 2017-12-11 DIAGNOSIS — F332 Major depressive disorder, recurrent severe without psychotic features: Principal | ICD-10-CM

## 2017-12-11 NOTE — Plan of Care (Signed)
  Problem: Education: Goal: Knowledge of [facility] Education information/materials will improve Outcome: Progressing Goal: Emotional status will improve Outcome: Progressing Goal: Mental status will improve Outcome: Progressing   Problem: Coping: Goal: Coping ability will improve Outcome: Progressing

## 2017-12-11 NOTE — Plan of Care (Signed)
Visible in the milieu, cooperative and compliant with treatment 

## 2017-12-11 NOTE — Progress Notes (Signed)
Recreation Therapy Notes  Date: 12/11/2017  Time: 9:30 am   Location: Craft Room   Behavioral response: N/A   Intervention Topic:  Creative Expressions  Discussion/Intervention: Patient did not attend group.   Clinical Observations/Feedback:  Patient did not attend group.   Jannae Fagerstrom LRT/CTRS        Eliab Closson 12/11/2017 11:06 AM 

## 2017-12-11 NOTE — Progress Notes (Signed)
D: Patient observed in bed this morning.  He did get up to go to treatment team meeting.  He denies any depressive symptoms or thoughts of self harm.  He is sleeping and eating well; his energy level is good; his concentration is good.  He is visible in the milieu and is interacting with his peers.  His mood is stable and he does not appear to be in any physical distress.  A: Continue to monitor medication management and MD orders.  Safety checks completed every 15 minutes per protocol.  Offer support and encouragement as needed.  R: Patient is receptive to staff; his behavior is appropriate.

## 2017-12-11 NOTE — Tx Team (Signed)
Interdisciplinary Treatment and Diagnostic Plan Update  12/11/2017 Time of Session: :10:54am Logan Christensen MRN: [id number]  Principal Diagnosis: MDD (major depressive disorder), recurrent episode, severe (HCC)  Secondary Diagnoses: Principal Problem:   MDD (major depressive disorder), recurrent episode, severe (HCC) Active Problems:   MDD (major depressive disorder)   Cocaine use disorder, moderate, dependence (HCC)   Amphetamine use disorder, mild (HCC)   Current Medications:  Current Facility-Administered Medications  Medication Dose Route Frequency Provider Last Rate Last Dose  . acetaminophen (TYLENOL) tablet 650 mg  650 mg Oral Q6H PRN He, Jun, MD      . alum & mag hydroxide-simeth (MAALOX/MYLANTA) 200-200-20 MG/5ML suspension 30 mL  30 mL Oral Q4H PRN He, Jun, MD      . magnesium hydroxide (MILK OF MAGNESIA) suspension 30 mL  30 mL Oral Daily PRN He, Jun, MD      . nicotine (NICODERM CQ - dosed in mg/24 hours) patch 21 mg  21 mg Transdermal Daily He, Jun, MD   21 mg at 12/11/17 1100  . traZODone (DESYREL) tablet 50 mg  50 mg Oral QHS PRN He, Jun, MD       PTA Medications: Medications Prior to Admission  Medication Sig Dispense Refill Last Dose  . ibuprofen (ADVIL,MOTRIN) 200 MG tablet Take 200-400 mg by mouth every 6 (six) hours as needed for headache or moderate pain.   Past Month at Unknown time    Patient Stressors: Substance abuse Traumatic event  Patient Strengths: Ability for insight Active sense of humor Average or above average intelligence Capable of independent living Supportive family/friends  Treatment Modalities: Medication Management, Group therapy, Case management,  1 to 1 session with clinician, Psychoeducation, Recreational therapy.   Physician Treatment Plan for Primary Diagnosis: MDD (major depressive disorder), recurrent episode, severe (HCC) Long Term Goal(s): Improvement in symptoms so as ready for discharge Improvement in symptoms so as  ready for discharge   Short Term Goals: Ability to identify changes in lifestyle to reduce recurrence of condition will improve Ability to identify changes in lifestyle to reduce recurrence of condition will improve  Medication Management: Evaluate patient's response, side effects, and tolerance of medication regimen.  Therapeutic Interventions: 1 to 1 sessions, Unit Group sessions and Medication administration.  Evaluation of Outcomes: Progressing  Physician Treatment Plan for Secondary Diagnosis: Principal Problem:   MDD (major depressive disorder), recurrent episode, severe (HCC) Active Problems:   MDD (major depressive disorder)   Cocaine use disorder, moderate, dependence (HCC)   Amphetamine use disorder, mild (HCC)  Long Term Goal(s): Improvement in symptoms so as ready for discharge Improvement in symptoms so as ready for discharge   Short Term Goals: Ability to identify changes in lifestyle to reduce recurrence of condition will improve Ability to identify changes in lifestyle to reduce recurrence of condition will improve     Medication Management: Evaluate patient's response, side effects, and tolerance of medication regimen.  Therapeutic Interventions: 1 to 1 sessions, Unit Group sessions and Medication administration.  Evaluation of Outcomes: Progressing   RN Treatment Plan for Primary Diagnosis: MDD (major depressive disorder), recurrent episode, severe (HCC) Long Term Goal(s): Knowledge of disease and therapeutic regimen to maintain health will improve  Short Term Goals: Ability to verbalize frustration and anger appropriately will improve, Ability to demonstrate self-control, Ability to identify and develop effective coping behaviors will improve and Compliance with prescribed medications will improve  Medication Management: RN will administer medications as ordered by provider, will assess and evaluate patient's response  and provide education to patient for  prescribed medication. RN will report any adverse and/or side effects to prescribing provider.  Therapeutic Interventions: 1 on 1 counseling sessions, Psychoeducation, Medication administration, Evaluate responses to treatment, Monitor vital signs and CBGs as ordered, Perform/monitor CIWA, COWS, AIMS and Fall Risk screenings as ordered, Perform wound care treatments as ordered.  Evaluation of Outcomes: Progressing   LCSW Treatment Plan for Primary Diagnosis: MDD (major depressive disorder), recurrent episode, severe (HCC) Long Term Goal(s): Safe transition to appropriate next level of care at discharge, Engage patient in therapeutic group addressing interpersonal concerns.  Short Term Goals: Engage patient in aftercare planning with referrals and resources, Increase social support, Increase emotional regulation, Identify triggers associated with mental health/substance abuse issues and Increase skills for wellness and recovery  Therapeutic Interventions: Assess for all discharge needs, 1 to 1 time with Social worker, Explore available resources and support systems, Assess for adequacy in community support network, Educate family and significant other(s) on suicide prevention, Complete Psychosocial Assessment, Interpersonal group therapy.  Evaluation of Outcomes: Progressing   Progress in Treatment: Attending groups: Yes. Participating in groups: Yes. Taking medication as prescribed: Yes. Toleration medication: Yes. Family/Significant other contact made: No, will contact:  Patient refused Patient understands diagnosis: Yes. Discussing patient identified problems/goals with staff: Yes. Medical problems stabilized or resolved: Yes. Denies suicidal/homicidal ideation: Yes. Issues/concerns per patient self-inventory: No. Other:   New problem(s) identified: No, Describe:  None  New Short Term/Long Term Goal(s): "To use better judgement."  Patient Goals:  "To use better  judgement."  Discharge Plan or Barriers: [facility]  Reason for Continuation of Hospitalization: Medication stabilization  Estimated Length of Stay: 1 day  Attendees: Patient: Logan Christensen 12/11/2017 11:03 AM  Physician: Dr. Johnella MoloneyMcNew, MD 12/11/2017 11:03 AM  Nursing: [place] ReaderPhyllis Cobb, RN 12/11/2017 11:03 AM  RN Care Manager: 12/11/2017 11:03 AM  Social Worker: Logan ShearsCassandra Braidyn Christensen, LCSWA 12/11/2017 11:03 AM  Recreational Therapist: Danella DeisShay. Dreama SaaOutlaw CTRS, LRT 12/11/2017 11:03 AM  Other: Heidi DachKelsey Craig, LCSW 12/11/2017 11:03 AM  Other: Jake SharkSara Laws, LCSW 12/11/2017 11:03 AM  Other: 12/11/2017 11:03 AM    Scribe for Treatment Team: Logan Christensen  Logan Mcnay, LCSW 12/11/2017 11:03 AM

## 2017-12-11 NOTE — Progress Notes (Signed)
[facility] MD Progress Note  12/11/2017 12:04 PM Logan Christensen  MRN:  [id number] Subjective:  History was reviewed with patient. He states that he has been under a lot of stress lately. He states, "It was just building up." He states that on Friday things got too much for him. He laughs and states, "I had the fantastic idea to take Unisom to kill myself." He states that he went to [employer] and bought a bottle of Unasom and took the whole bottle. He states that after 30-5m minutes "I changed my mind and went in to get help." When asked what changed his mind, he states, "I had a weird instinct i guess." He states that this was very impulsive and not something he was planning. When asked why he chose Unisom, he states, "I guess I thought it would just put me to sleep and then I would die while sleeping." He states that he is very glad to be alive and he regrets this overdose. He states that this was his first suicide attempts. He is living with his foster parents which is a better living situation for him. They are supportive. He states that he has a lot of friends and family that he reaches out to. He also has his church. He also uses cocaine and Adderall (not prescribed) "not that much." He states that he was using it "because I was around my friend using it." He minimizes the use. Pt is future oriented and states that wants to start seeing a counselor. He denies any history of decreased need for sleep or sustained periods of euphoria. 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 No unsafe behaviors on the unit. Denies SI today. He states mood is better because he has had time to think and process. He denies feeling depressed and that "I am always happy." He reports that he slept well last night. 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 Speech is rapid and difficult to hear but redirects well.   Principal Problem: MDD (major depressive disorder), recurrent episode, severe  (HCC) Diagnosis:   Patient Active Problem List   Diagnosis Date Noted  . MDD (major depressive disorder) [F32.9] 12/09/2017  . MDD (major depressive disorder), recurrent episode, severe (HCC) [F33.2] 12/09/2017  . Cocaine use disorder, moderate, dependence (HCC) [F14.20] 12/09/2017  . 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 ALLERGIC RHINITIS, CHRONIC [J30.9] 03/08/2007   Total Time spent with patient: 20 minutes  Past Psychiatric History: See H&P  Past Medical History: History reviewed. No pertinent past medical history. History reviewed. No pertinent surgical history. Family History: History reviewed. No pertinent family history. Family Psychiatric  History: See h&P Social History:  Social History   Substance and Sexual Activity  Alcohol Use Not on file     Social History   Substance and Sexual Activity  Drug Use Not on file    Social History   Socioeconomic History  . Marital status: Single    Spouse name: Not on file  . Number of children: Not on file  . Years of education: Not on file  . Highest education level: Not on file  Occupational History  . Not on file  Social Needs  . Financial resource strain: Not on file  . Food insecurity:    Worry: Not on file    Inability: Not on file  . Transportation needs:    Medical: Not on file    Non-medical:  Not on file  Tobacco Use  . Smoking status: Current Every Day Smoker    Packs/day: 1.00  . Smokeless tobacco: Current User  Substance and Sexual Activity  . Alcohol use: Not on file  . Drug use: Not on file  . Sexual activity: Not on file  Lifestyle  . Physical activity:    Days per week: Not on file    Minutes per session: Not on file  . Stress: Not on file  Relationships  . Social connections:    Talks on phone: Not on file    Gets together: Not on file    Attends religious service: Not on file    Active member of club or organization: Not on file    Attends meetings of clubs or  organizations: Not on file    Relationship status: Not on file  Other Topics Concern  . Not on file  Social History Narrative  . Not on file   Additional Social History:                         Sleep: Fair  Appetite:  Fair  Current Medications: Current Facility-Administered Medications  Medication Dose Route Frequency Provider Last Rate Last Dose  . acetaminophen (TYLENOL) tablet 650 mg  650 mg Oral Q6H PRN He, Jun, MD      . alum & mag hydroxide-simeth (MAALOX/MYLANTA) 200-200-20 MG/5ML suspension 30 mL  30 mL Oral Q4H PRN He, Jun, MD      . magnesium hydroxide (MILK OF MAGNESIA) suspension 30 mL  30 mL Oral Daily PRN He, Jun, MD      . nicotine (NICODERM CQ - dosed in mg/24 hours) patch 21 mg  21 mg Transdermal Daily He, Jun, MD   21 mg at 12/11/17 1100  . traZODone (DESYREL) tablet 50 mg  50 mg Oral QHS PRN He, Jun, MD        Lab Results: No results found for this or any previous visit (from the past 48 hour(s)).  Blood Alcohol level:  Lab Results  Component Value Date   ETH <10 12/09/2017    Metabolic Disorder Labs: No results found for: HGBA1C, MPG No results found for: PROLACTIN No results found for: CHOL, TRIG, HDL, CHOLHDL, VLDL, LDLCALC  Physical Findings: AIMS:  , ,  ,  ,    CIWA:    COWS:     Musculoskeletal: Strength & Muscle Tone: within normal limits Gait & Station: normal Patient leans: N/A  Psychiatric Specialty Exam: Physical Exam  Nursing note and vitals reviewed.   Review of Systems  All other systems reviewed and are negative.   Blood pressure 118/69, pulse 89, temperature (!) 97.4 F (36.3 C), temperature source Oral, resp. rate 18, height 5\' 11"  (1.803 m), weight 72.6 kg (160 lb), SpO2 98 %.Body mass index is 22.32 kg/m.  General Appearance: Casual  Eye Contact:  Good  Speech:  Rapid  Volume:  Normal  Mood:  Euthymic  Affect:  Congruent  Thought Process:  Goal Directed  Orientation:  Full (Time, Place, and Person)   Thought Content:  Logical  Suicidal Thoughts:  No  Homicidal Thoughts:  No  Memory:  NA  Judgement:  Fair  Insight:  Fair  Psychomotor Activity:  Normal  Concentration:  Concentration: Fair  Recall:  [employer]  Fund of Knowledge:  Fair  Language:  Fair  Akathisia:  No      Assets:  Resilience  ADL's:  Intact  Cognition:  WNL  Sleep:  Number of Hours: 7     Treatment Plan Summary: 22 yo male admitted due to overdose on Unisom as a suicide attempt. He reports improvement in mood today and denies SI. He expresses remorse for the attempt and is glad to be alive. His speech is rapid but seems related to anxiety as opposed to mania. He does not appear manic. He is sleeping well. HE continues to decline any medications.   Plan:  Mood disorder -Pt declines medications including mood stabilizers  Polysubstance abuse -Pt minimizes and does not want treatment  Dispo -Discharge home when stable. He has [facility] benefits Haskell Riling, MD 12/11/2017, 12:04 PM

## 2017-12-11 NOTE — B[facility] Group Notes (Signed)
B[facility] Group Notes:  (Nursing/MHT/Case Management/Adjunct)  Date:  12/11/2017  Time:  9:03 PM  Type of Therapy:  Group Therapy  Participation Level:  Active  Participation Quality:  Appropriate  Affect:  Appropriate  Cognitive:  Appropriate  Insight:  Appropriate  Engagement in Group:  Engaged  Modes of Intervention:  Support  Summary of Progress/Problems:  Logan Christensen 12/11/2017, 9:03 PM

## 2017-12-11 NOTE — Progress Notes (Signed)
Patient stayed in the milieu and remained cooperative. Attended HS activities. Alert and oriented and denying thoughts of self harm. Patient was pleasant and able to express his needs and feelings. Stayed around until  Bed time and did not have any concerns. Currently in bed sleeping. No sign of discomfort. Safety precautions maintained.

## 2017-12-12 NOTE — Plan of Care (Signed)
Patient is energetic and socializing well with peers , attending groups and participating being assertive , denies SI/HI aware of her coping skills, no concerns voiced by patient. Problem: Education: Goal: Knowledge of [facility] Education information/materials will improve Outcome: Progressing Goal: Emotional status will improve Outcome: Progressing Goal: Mental status will improve Outcome: Progressing Goal: Verbalization of understanding the information provided will improve Outcome: Progressing   Problem: Coping: Goal: Coping ability will improve Outcome: Progressing Goal: Will verbalize feelings Outcome: Progressing   Problem: Medication: Goal: Compliance with prescribed medication regimen will improve Outcome: Progressing   Problem: Self-Concept: Goal: Will verbalize positive feelings about self Outcome: Progressing

## 2017-12-12 NOTE — [facility] Suicide Risk Assessment (Signed)
[facility]   Principal Problem: MDD (major depressive disorder), recurrent episode, severe (HCC) Discharge Diagnoses:  Patient Active Problem List   Diagnosis Date Noted  . MDD (major depressive disorder) [F32.9] 12/09/2017  . MDD (major depressive disorder), recurrent episode, severe (HCC) [F33.2] 12/09/2017  . Cocaine use disorder, moderate, dependence (HCC) [F14.20] 12/09/2017  . 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 ALLERGIC RHINITIS, CHRONIC [J30.9] 03/08/2007    Mental Status Per Nursing Assessment::   On Admission:  NA  Demographic Factors:  Male  Loss Factors: NA  Historical Factors: Impulsivity  Risk Reduction Factors:   Living with another person, especially a relative, Positive social support, Positive therapeutic relationship and Positive coping skills or problem solving skills  Continued Clinical Symptoms:  None  Cognitive Features That Contribute To Risk:  None    Suicide Risk:  Minimal: No identifiable suicidal ideation.   Follow-up Information    [facility]13/2017.   Why:  [facility]ag[facility] on Wednesday 12/13/2017 at 9am. Thank you. Contact information: Address[address] [place], [place] [address] Phone: [phone] Fax:          Plan Of Care/Follow-up recommendations: [facility]  Haskell RilingHolly R Josefa Syracuse, MD 12/12/2017, 9:04 AM

## 2017-12-12 NOTE — Discharge Summary (Signed)
Physician Discharge Summary Note  Patient:  Logan Christensen is an 22 y.o., male MRN:  [id number] DOB:  17-Sep-1995 Patient phone:  [phone] (home)  Patient address:   [address] [place] [address],  Total Time spent with patient: 15 minutes  Plus 20 minutes of medication reconciliation, discharge planning, and discharge documentation   Date of Admission:  12/09/2017 Date of Discharge: 12/12/17  Reason for Admission:  Overdose  Principal Problem: MDD (major depressive disorder), recurrent episode, severe [facility]) Discharge Diagnoses: Patient Active Problem List   Diagnosis Date Noted  . MDD (major depressive disorder) [F32.9] 12/09/2017  . MDD (major depressive disorder), recurrent episode, severe (HCC) [F33.2] 12/09/2017  . Cocaine use disorder, moderate, dependence (HCC) [F14.20] 12/09/2017  . 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 ALLERGIC RHINITIS, CHRONIC [J30.9] 03/08/2007    Past Psychiatric History: See H&P  Past Medical History: History reviewed. No pertinent past medical history. History reviewed. No pertinent surgical history. Family History: History reviewed. No pertinent family history. Family Psychiatric  History: See H&P Social History:  Social History   Substance and Sexual Activity  Alcohol Use Not on file     Social History   Substance and Sexual Activity  Drug Use Not on file    Social History   Socioeconomic History  . Marital status: Single    Spouse name: Not on file  . Number of children: Not on file  . Years of education: Not on file  . Highest education level: Not on file  Occupational History  . Not on file  Social Needs  . Financial resource strain: Not on file  . Food insecurity:    Worry: Not on file    Inability: Not on file  . Transportation needs:    Medical: Not on file    Non-medical: Not on file  Tobacco Use  . Smoking status: Current Every Day Smoker    Packs/day: 1.00  . Smokeless tobacco:  Current User  Substance and Sexual Activity  . Alcohol use: Not on file  . Drug use: Not on file  . Sexual activity: Not on file  Lifestyle  . Physical activity:    Days per week: Not on file    Minutes per session: Not on file  . Stress: Not on file  Relationships  . Social connections:    Talks on phone: Not on file    Gets together: Not on file    Attends religious service: Not on file    Active member of club or organization: Not on file    Attends meetings of clubs or organizations: Not on file    Relationship status: Not on file  Other Topics Concern  . Not on file  Social History Narrative  . Not on file    Hospital Course:  Pt attended groups during hospitalization. He declined any medications despite discussing benefits of mood stabilizers. He had no unsafe behaviors on the unit. He was bright and cheerful. He consistently denied SI or thoughts of self harm. He expressed guilt and remorse for the overdose. He is very happy to be alive. On day of discharge, he stated that feels ready to go home. He denied SI or any thoughts of self harm. Denies HI, AH, VH. He slept well. He is future oriented and plans [facility] to [facility]. He is bright and cheerful. He does not appear manic or depressed.   The patient is at low risk  of imminent suicide. Patient denied thoughts, intent, or plan for harm to self or others, expressed significant future orientation, and expressed an ability to mobilize assistance for his needs. he is presently void of any contributing psychiatric symptoms, cognitive difficulties, or substance use which would elevate his risk for lethality. Chronic risk for lethality is elevated in light of male gender, impulsivity. The chronic risk is presently mitigated by his ongoing desire and [facility] and mobilization of support from family and friends. Chronic risk may elevate if he experiences any significant loss or worsening of  symptoms, which can be managed and monitored through outpatient providers. At this time,a cute risk for lethality is low and he is stable for ongoing outpatient management.   Modifiable risk factors were addressed during this hospitalization through appropriate pharmacotherapy and establishment of outpatient follow-up treatment. Some risk factors for suicide are situational (i.e. Unstable housing) or related personality pathology (i.e. Poor coping mechanisms) and thus cannot be further mitigated by continued hospitalization in this setting.    Physical Findings: AIMS:  , ,  ,  ,    CIWA:    COWS:     Musculoskeletal: Strength & Muscle Tone: within normal limits Gait & Station: normal Patient leans: N/A  Psychiatric Specialty Exam: Physical Exam  Nursing note and vitals reviewed.   Review of Systems  All other systems reviewed and are negative.   Blood pressure 106/72, pulse 97, temperature 97.7 F (36.5 C), temperature source Oral, resp. rate 16, height 5\' 11"  (1.803 m), weight 72.6 kg (160 lb), SpO2 98 %.Body mass index is 22.32 kg/m.  General Appearance: Casual  Eye Contact:  Good  Speech:  Rapid  Volume:  Normal  Mood:  Euthymic  Affect:  Congruent  Thought Process:  Coherent and Goal Directed  Orientation:  Full (Time, Place, and Person)  Thought Content:  Logical  Suicidal Thoughts:  No  Homicidal Thoughts:  No  Memory:  Immediate;   Fair  Judgement:  Fair  Insight:  Fair  Psychomotor Activity:  Normal  Concentration:  Concentration: Fair  Recall:  [employer]iservFair  Fund of Knowledge:  Fair  Language:  Fair  Akathisia:  No      Assets:  Resilience  ADL's:  Intact  Cognition:  WNL  Sleep:  Number of Hours: 5.75     Have you used any form of tobacco in the last 30 days? (Cigarettes, Smokeless Tobacco, Cigars, and/or Pipes): Yes  Has this patient used any form of tobacco in the last 30 days? (Cigarettes, Smokeless Tobacco, Cigars, and/or Pipes) Yes, Yes, A prescription  for an FDA-approved tobacco cessation medication was offered at discharge and the patient refused  Blood Alcohol level:  Lab Results  Component Value Date   ETH <10 12/09/2017    Metabolic Disorder Labs:  No results found for: HGBA1C, MPG No results found for: PROLACTIN No results found for: CHOL, TRIG, HDL, CHOLHDL, VLDL, LDLCALC  See Psychiatric Specialty Exam and Suicide Risk Assessment completed by Attending Physician prior to discharge.  Discharge destination:  Home  Is patient on multiple antipsychotic therapies at discharge:  No   Has Patient had three or more failed trials of antipsychotic monotherapy by history:  No  Recommended Plan for Multiple Antipsychotic Therapies: NA  Discharge Instructions    Increase activity slowly   Complete by:  As directed      Allergies as of 12/12/2017   No Known Allergies     Medication List  STOP taking these medications   ibuprofen 200 MG tablet Commonly known as:  ADVIL,MOTRIN      Follow-up Information    [facility] ([place]). Go on 12/13/2017.   Why:  Please follow up with [facility] for Medication Managament on Wednesday 12/13/2017 at 9am. Thank you. Contact information: Address:[address] [place], [place] [address] Phone: [phone] Fax:          Follow-up recommendations:  [facility]  Signed: Haskell Riling, MD 12/12/2017, 9:05 AM

## 2017-12-12 NOTE — BHH Group Notes (Signed)
12/12/2017 1PM  Type of Therapy/Topic:  Group Therapy:  Feelings about Diagnosis  Participation Level:  Did Not Attend   Description of Group:   This group will allow patients to explore their thoughts and feelings about diagnoses they have received. Patients will be guided to explore their level of understanding and acceptance of these diagnoses. Facilitator will encourage patients to process their thoughts and feelings about the reactions of others to their diagnosis and will guide patients in identifying ways to discuss their diagnosis with significant others in their lives. This group will be process-oriented, with patients participating in exploration of their own experiences, giving and receiving support, and processing challenge from other group members.   Therapeutic Goals: 1. Patient will demonstrate understanding of diagnosis as evidenced by identifying two or more symptoms of the disorder 2. Patient will be able to express two feelings regarding the diagnosis 3. Patient will demonstrate their ability to communicate their needs through discussion and/or role play  Summary of Patient Progress: Patient was encouraged and invited to attend group. Patient did not attend group. Social worker will continue to encourage group participation in the future.      Therapeutic Modalities:   Cognitive Behavioral Therapy Brief Therapy Feelings Identification    Johny ShearsCassandra  Jacci Ruberg, LCSW 12/12/2017 2:01 PM

## 2017-12-12 NOTE — BHH Group Notes (Signed)
CSW Group Therapy Note  12/12/2017  Time:  0900  Type of Therapy and Topic: Group Therapy: Goals Group: SMART Goals    Participation Level:  Did Not Attend    Description of Group:   The purpose of a daily goals group is to assist and guide patients in setting recovery/wellness-related goals. The objective is to set goals as they relate to the crisis in which they were admitted. Patients will be using SMART goal modalities to set measurable goals. Characteristics of realistic goals will be discussed and patients will be assisted in setting and processing how one will reach their goal. Facilitator will also assist patients in applying interventions and coping skills learned in psycho-education groups to the SMART goal and process how one will achieve defined goal.    Therapeutic Goals:  -Patients will develop and document one goal related to or their crisis in which brought them into treatment.  -Patients will be guided by LCSW using SMART goal setting modality in how to set a measurable, attainable, realistic and time sensitive goal.  -Patients will process barriers in reaching goal.  -Patients will process interventions in how to overcome and successful in reaching goal.    Patient's Goal:  Pt was invited to attend group but chose not to attend. CSW will continue to encourage pt to attend group throughout their admission.   Therapeutic Modalities:  Motivational Interviewing  Cognitive Behavioral Therapy  Crisis Intervention Model  SMART goals setting  Heidi DachKelsey Saranda Legrande, MSW, LCSW Clinical Social Worker 12/12/2017 9:31 AM

## 2017-12-12 NOTE — Progress Notes (Signed)
Recreation Therapy Notes  Date: 12/12/2017  Time: 9:30 am   Location: Craft Room   Behavioral response: N/A   Intervention Topic:  Stress  Discussion/Intervention: Patient did not attend group.   Clinical Observations/Feedback:  Patient did not attend group.   Dorianna Mckiver LRT/CTRS        Maudry Zeidan 12/12/2017 10:45 AM 

## 2017-12-12 NOTE — Progress Notes (Signed)
  [facility] :  Will you be returning to the same living situation after discharge:  Yes,  To live with foster parents At discharge, do you have transportation home?: Yes,  Malen GauzeFoster parents will come at discharge Do you have the ability to pay for your medications: Yes,  Referred to a provider who can assist  Release of information consent forms completed and in the chart;  Patient's signature needed at discharge.  Patient to Follow up at: Follow-up Information    [facility] Go on 12[place]13/2017.   Why:  [facility] for Medication Managament on Wednesday 12/13/2017 at 9am. Thank you. Contact information: [address] [place], [place] [address] Phone: [phone] Fax:          Next level of care provider has access to [facility] Link:no  Safety Planning and Suicide Prevention discussed: Yes,  Completed with patient  Have you used any form of tobacco in the last 30 days? (Cigarettes, Smokeless Tobacco, Cigars, and/or Pipes): Yes  Has patient been referred to the Quitline?: Patient refused referral  Patient has been referred for addiction treatment: Yes  Logan ShearsCassandra  Logan Ditmore, LCSW 12/12/2017, 9:06 AM

## 2017-12-12 NOTE — Progress Notes (Signed)
Logan Christensen received his discharge order, the AVS was reviewed and his questions answered. His personal items were returned. He was discharge without incident  with family.

## 2018-04-14 ENCOUNTER — Emergency Department (HOSPITAL_COMMUNITY)
Admission: EM | Admit: 2018-04-14 | Discharge: 2018-04-14 | Disposition: A | Discharge status: Home / Self Care | Payer: Non-veteran care | Attending: Emergency Medicine | Admitting: Emergency Medicine

## 2018-04-14 ENCOUNTER — Emergency Department (HOSPITAL_COMMUNITY): Payer: Non-veteran care

## 2018-04-14 ENCOUNTER — Encounter (HOSPITAL_COMMUNITY): Payer: Self-pay | Admitting: Emergency Medicine

## 2018-04-14 ENCOUNTER — Other Ambulatory Visit: Payer: Self-pay

## 2018-04-14 DIAGNOSIS — Y9389 Activity, other specified: Secondary | ICD-10-CM | POA: Insufficient documentation

## 2018-04-14 DIAGNOSIS — Y929 Unspecified place or not applicable: Secondary | ICD-10-CM | POA: Diagnosis not present

## 2018-04-14 DIAGNOSIS — T148XXA Other injury of unspecified body region, initial encounter: Secondary | ICD-10-CM | POA: Diagnosis not present

## 2018-04-14 DIAGNOSIS — F17210 Nicotine dependence, cigarettes, uncomplicated: Secondary | ICD-10-CM | POA: Diagnosis not present

## 2018-04-14 DIAGNOSIS — Y999 Unspecified external cause status: Secondary | ICD-10-CM | POA: Insufficient documentation

## 2018-04-14 MED ORDER — TETRACAINE HCL 0.5 % OP SOLN: 2.0000 [drp] | Freq: Once | OPHTHALMIC | Status: DC

## 2018-04-14 MED ORDER — IBUPROFEN 600 MG PO TABS: 600.0000 mg | ORAL_TABLET | Freq: Four times a day (QID) | ORAL | 0 refills | Status: AC

## 2018-04-14 MED ORDER — CYCLOBENZAPRINE HCL 10 MG PO TABS
10.0000 mg | ORAL_TABLET | Freq: Three times a day (TID) | ORAL | 0 refills | Status: AC
Start: 2018-04-14 — End: ?
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
## 2018-04-14 NOTE — Discharge Instructions (Addendum)
Your vital signs within normal limits.  Your oxygen level is 99% on room air.  Within normal limits by my interpretation.  The x-ray of your cervical spine is negative for fracture, or dislocation.  There are no gross neurologic deficits noted on your examination at this time.  You can expect to be sore in multiple areas over the next few days.  Please use ibuprofen with breakfast, lunch, dinner, and at bedtime.  Please use Flexeril 3 times daily for spasm pain. This medication may cause drowsiness. Please do not drink, drive, or participate in activity that requires concentration while taking this medication.

## 2018-04-14 NOTE — ED Notes (Signed)
To Rad 

## 2018-04-14 NOTE — ED Notes (Signed)
HB in to assess 
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
## 2019-07-05 ENCOUNTER — Other Ambulatory Visit: Payer: Self-pay

## 2019-07-05 ENCOUNTER — Emergency Department (HOSPITAL_COMMUNITY)
Admission: EM | Admit: 2019-07-05 | Discharge: 2019-07-05 | Disposition: A | Discharge status: Home / Self Care | Payer: No Typology Code available for payment source | Attending: Emergency Medicine | Admitting: Emergency Medicine

## 2019-07-05 ENCOUNTER — Encounter (HOSPITAL_COMMUNITY): Payer: Self-pay | Admitting: *Deleted

## 2019-07-05 DIAGNOSIS — F17210 Nicotine dependence, cigarettes, uncomplicated: Secondary | ICD-10-CM | POA: Diagnosis not present

## 2019-07-05 DIAGNOSIS — M7918 Myalgia, other site: Secondary | ICD-10-CM | POA: Diagnosis present

## 2019-07-05 DIAGNOSIS — R519 Headache, unspecified: Secondary | ICD-10-CM | POA: Diagnosis not present

## 2019-07-05 DIAGNOSIS — U071 COVID-19: Secondary | ICD-10-CM | POA: Insufficient documentation

## 2019-07-05 DIAGNOSIS — R52 Pain, unspecified: Secondary | ICD-10-CM

## 2019-07-05 NOTE — ED Provider Notes (Addendum)
[facility] Provider Note   CSN: [id number] Arrival date & time: 07/05/19  1024     History Chief Complaint  Patient presents with  . Generalized Body Aches    Logan Christensen is a 24 y.o. male.  HPI      Logan Christensen is a 24 y.o. male, patient with history of tobacco use, presenting to the ED with intermittent body aches beginning December 28.  Also notes intermittent, mild headaches. He had exposure to COVID-19 through his grandmother.  He had contact with her December 24 and she later tested positive for Covid. Requesting test for COVID-19. Denies fever/chills, shortness of breath, cough, chest pain, syncope, dizziness, N/V/D, abdominal pain, or any other complaints.    History reviewed. No pertinent past medical history.  Patient Active Problem List   Diagnosis Date Noted  . MDD (major depressive disorder) 12/09/2017  . MDD (major depressive disorder), recurrent episode, severe ([place]) 12/09/2017  . Cocaine use disorder, moderate, dependence ([place]) 12/09/2017  . Amphetamine use disorder, mild ([place]) 12/09/2017  . ALLERGIC RHINITIS, CHRONIC 03/08/2007    History reviewed. No pertinent surgical history.     History reviewed. No pertinent family history.  Social History   Tobacco Use  . Smoking status: Current Every Day Smoker    Packs/day: 1.00  . Smokeless tobacco: Current User  Substance Use Topics  . Alcohol use: Not on file    Comment: occ  . Drug use: Never    Home Medications Prior to Admission medications   Medication Sig Start Date End Date Taking? Authorizing Provider  cyclobenzaprine (FLEXERIL) 10 MG tablet Take 1 tablet (10 mg total) by mouth 3 (three) times daily. 04/14/18   Lily Kocher, PA-C  ibuprofen (ADVIL,MOTRIN) 600 MG tablet Take 1 tablet (600 mg total) by mouth 4 (four) times daily. 04/14/18   Lily Kocher, PA-C    Allergies    Patient has no known allergies.  Review of Systems   Review of Systems    Constitutional: Negative for chills, diaphoresis and fever.  Respiratory: Negative for cough and shortness of breath.   Cardiovascular: Negative for chest pain.  Gastrointestinal: Negative for abdominal pain, diarrhea, nausea and vomiting.  Musculoskeletal: Positive for myalgias.  Neurological: Positive for headaches. Negative for dizziness, syncope, weakness and light-headedness.  All other systems reviewed and are negative.   Physical Exam Updated Vital Signs BP 117/62   Pulse 69   Temp 99.3 F (37.4 C)   Resp 20   Ht 5\' 11"  (1.803 m)   Wt 86.2 kg   SpO2 100%   BMI 26.50 kg/m   Physical Exam Vitals and nursing note reviewed.  Constitutional:      General: He is not in acute distress.    Appearance: He is well-developed. He is not diaphoretic.  HENT:     Head: Normocephalic and atraumatic.     Mouth/Throat:     Mouth: Mucous membranes are moist.     Pharynx: Oropharynx is clear.  Eyes:     Conjunctiva/sclera: Conjunctivae normal.  Cardiovascular:     Rate and Rhythm: Normal rate and regular rhythm.     Pulses: Normal pulses.          Radial pulses are 2+ on the right side and 2+ on the left side.     Heart sounds: Normal heart sounds.  Pulmonary:     Effort: Pulmonary effort is normal. No respiratory distress.     Breath sounds: Normal breath sounds.  Abdominal:     Palpations: Abdomen is soft.     Tenderness: There is no abdominal tenderness. There is no guarding.  Musculoskeletal:     Cervical back: Neck supple.  Lymphadenopathy:     Cervical: No cervical adenopathy.  Skin:    General: Skin is warm and dry.  Neurological:     Mental Status: He is alert.     Comments: Alert, attentive, makes good eye contact.  No apparent distress.  Gestures with both upper extremities.  No gait deficit.  Psychiatric:        Mood and Affect: Mood and affect normal.        Speech: Speech normal.        Behavior: Behavior normal.     ED Results / Procedures / Treatments    Labs (all labs ordered are listed, but only abnormal results are displayed) Labs Reviewed  NOVEL CORONAVIRUS, NAA (HOSP ORDER, SEND-OUT TO REF LAB; TAT 18-24 HRS)    EKG None  Radiology No results found.  Procedures Procedures (including critical care time)  Medications Ordered in ED Medications - No data to display  ED Course  I have reviewed the triage vital signs and the nursing notes.  Pertinent labs & imaging results that were available during my care of the patient were reviewed by me and considered in my medical decision making (see chart for details).    MDM Rules/Calculators/A&P                      Patient presents requesting testing for COVID-19.  Complaining of body aches and occasional headache. Patient is nontoxic appearing, afebrile, not tachycardic, not tachypneic, not hypotensive,  excellent SPO2 on room air, and is in no apparent distress.  The patient was given instructions for home care as well as return precautions. Patient voices understanding of these instructions, accepts the plan, and is comfortable with discharge.      Logan Christensen was evaluated in Emergency Department on 07/05/2019 for the symptoms described in the history of present illness. He was evaluated in the context of the global COVID-19 pandemic, which necessitated consideration that the patient might be at risk for infection with the SARS-CoV-2 virus that causes COVID-19. Institutional protocols and algorithms that pertain to the evaluation of patients at risk for COVID-19 are in a state of rapid change based on information released by regulatory bodies including the CDC and federal and state organizations. These policies and algorithms were followed during the patient's care in the ED.  Final Clinical Impression(s) / ED Diagnoses Final diagnoses:  Body aches  Mild headache    Rx / DC Orders ED Discharge Orders    None       Concepcion Living 07/05/19 1127    Anselm Pancoast, PA-C 07/05/19 1127    Eber Hong, MD 07/15/19 1742

## 2019-07-05 NOTE — ED Notes (Signed)
ED Provider at bedside. 

## 2019-07-05 NOTE — ED Notes (Signed)
States his grandmother has covid and he was with her at Christmas

## 2019-07-05 NOTE — Discharge Instructions (Addendum)
Test Results for COVID-19 pending  You have a test pending for COVID-19.  Results typically return within about 48 hours.  Be sure to check MyChart for updated results.  We recommend isolating yourself until results are received.  Patients who have symptoms consistent with COVID-19 should self isolated for: At least 3 days (72 hours) have passed since recovery, defined as resolution of fever without the use of fever reducing medications and improvement in respiratory symptoms (e.g., cough, shortness of breath), and At least 7 days have passed since symptoms first appeared.  If you have no symptoms, but your test returns positive, recommend isolating for at least 10 days.   Viral Syndrome Care Guidelines Your symptoms are likely consistent with a viral illness. Viruses do not require or respond to antibiotics. Treatment is symptomatic care and it is important to note that these symptoms may last for 7-14 days.   Hand washing: Wash your hands throughout the day, but especially before and after touching the face, using the restroom, sneezing, coughing, or touching surfaces that have been coughed or sneezed upon. Hydration: Symptoms of most illnesses will be intensified and complicated by dehydration. Dehydration can also extend the duration of symptoms. Drink plenty of fluids and get plenty of rest. You should be drinking at least half a liter of water an hour to stay hydrated. Electrolyte drinks (ex. Gatorade, Powerade, Pedialyte) are also encouraged. You should be drinking enough fluids to make your urine light yellow, almost clear. If this is not the case, you are not drinking enough water. Please note that some of the treatments indicated below will not be effective if you are not adequately hydrated. Diet: Please concentrate on hydration, however, you may introduce food slowly.  Start with a clear liquid diet, progressed to a full liquid diet, and then bland solids as you are able. Pain or fever:  Ibuprofen, Naproxen, or acetaminophen (generic for Tylenol) for pain or fever.  Antiinflammatory medications: Take 600 mg of ibuprofen every 6 hours or 440 mg (over the counter dose) to 500 mg (prescription dose) of naproxen every 12 hours for the next 3 days. After this time, these medications may be used as needed for pain. Take these medications with food to avoid upset stomach. Choose only one of these medications, do not take them together. Acetaminophen (generic for Tylenol): Should you continue to have additional pain while taking the ibuprofen or naproxen, you may add in acetaminophen as needed. Your daily total maximum amount of acetaminophen from all sources should be limited to 4000mg /day for persons without liver problems, or 2000mg /day for those with liver problems. Zyrtec or Claritin: May add these medication daily to control underlying symptoms of congestion, sneezing, and other signs of allergies.  These medications are available over-the-counter. Generics: Cetirizine (generic for Zyrtec) and loratadine (generic for Claritin). Fluticasone: Use fluticasone (generic for Flonase), as directed, for nasal and sinus congestion.  This medication is available over-the-counter. Congestion: Plain guaifenesin (generic for plain Mucinex) may help relieve congestion. Saline sinus rinses and saline nasal sprays may also help relieve congestion. If you do not have high blood pressure, heart problems, or an allergy to such medications, you may also try phenylephrine or Sudafed. Sore throat: Warm liquids or Chloraseptic spray may help soothe a sore throat. Gargle twice a day with a salt water solution made from a half teaspoon of salt in a cup of warm water.  Follow up: Follow up with a primary care provider within the next two weeks should  symptoms fail to resolve. Return: Return to the ED for significantly worsening symptoms, shortness of breath, persistent vomiting, large amounts of blood in stool, or any  other major concerns.  For prescription assistance, may try using prescription discount sites or apps, such as goodrx.com

## 2019-07-05 NOTE — ED Triage Notes (Signed)
Request rapid covid test

## 2019-07-06 LAB — NOVEL CORONAVIRUS, NAA (HOSP ORDER, SEND-OUT TO REF LAB; TAT 18-24 HRS): SARS-CoV-2, NAA: DETECTED — AB

## 2019-07-08 ENCOUNTER — Telehealth (HOSPITAL_COMMUNITY): Payer: Self-pay

## 2020-01-21 ENCOUNTER — Other Ambulatory Visit: Payer: Self-pay

## 2020-01-21 ENCOUNTER — Emergency Department (HOSPITAL_COMMUNITY)
Admission: EM | Admit: 2020-01-21 | Discharge: 2020-01-21 | Disposition: A | Discharge status: Home / Self Care | Payer: No Typology Code available for payment source | Attending: Emergency Medicine | Admitting: Emergency Medicine

## 2020-01-21 ENCOUNTER — Encounter (HOSPITAL_COMMUNITY): Payer: Self-pay | Admitting: *Deleted

## 2020-01-21 DIAGNOSIS — F17200 Nicotine dependence, unspecified, uncomplicated: Secondary | ICD-10-CM | POA: Insufficient documentation

## 2020-01-21 DIAGNOSIS — T162XXA Foreign body in left ear, initial encounter: Secondary | ICD-10-CM | POA: Diagnosis not present

## 2020-01-21 DIAGNOSIS — Y939 Activity, unspecified: Secondary | ICD-10-CM | POA: Diagnosis not present

## 2020-01-21 DIAGNOSIS — Y999 Unspecified external cause status: Secondary | ICD-10-CM | POA: Diagnosis not present

## 2020-01-21 DIAGNOSIS — Y929 Unspecified place or not applicable: Secondary | ICD-10-CM | POA: Insufficient documentation

## 2020-01-21 DIAGNOSIS — W458XXA Other foreign body or object entering through skin, initial encounter: Secondary | ICD-10-CM | POA: Insufficient documentation

## 2020-01-21 NOTE — ED Triage Notes (Signed)
Pt with part of an ear bud stuck in left ear

## 2020-01-21 NOTE — ED Provider Notes (Signed)
[facility] EMERGENCY DEPARTMENT Provider Note   CSN: [id number] Arrival date & time: 01/21/20  1610     History Chief Complaint  Patient presents with  . Foreign Body in Ear    Logan Christensen is a 24 y.o. male.  HPI      Logan Christensen is a 24 y.o. male who presents to the Emergency Department complaining of foreign body in the left ear canal.  He states that he was wearing ear buds when the rubber tip of one of the ear buds became lodged in his left ear.  Someone tried to use tweezers to remove it but was unsuccessful.  He complains of pain to his left ear.  He denies dizziness, bleeding of the ear canal, and decreased hearing   History reviewed. No pertinent past medical history.  Patient Active Problem List   Diagnosis Date Noted  . MDD (major depressive disorder) 12/09/2017  . MDD (major depressive disorder), recurrent episode, severe (HCC) 12/09/2017  . Cocaine use disorder, moderate, dependence (HCC) 12/09/2017  . 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 ALLERGIC RHINITIS, CHRONIC 03/08/2007    History reviewed. No pertinent surgical history.     History reviewed. No pertinent family history.  Social History   Tobacco Use  . Smoking status: Current Every Day Smoker    Packs/day: 1.00  . Smokeless tobacco: Current User  Vaping Use  . Vaping Use: Never used  Substance Use Topics  . Alcohol use: Not on file    Comment: occ  . Drug use: Never    Home Medications Prior to Admission medications   Medication Sig Start Date End Date Taking? Authorizing Provider  cyclobenzaprine (FLEXERIL) 10 MG tablet Take 1 tablet (10 mg total) by mouth 3 (three) times daily. 04/14/18   Ivery Quale, PA-C  ibuprofen (ADVIL,MOTRIN) 600 MG tablet Take 1 tablet (600 mg total) by mouth 4 (four) times daily. 04/14/18   Ivery Quale, PA-C    Allergies    Patient has no known allergies.  Review of Systems   Review of Systems  Constitutional: Negative for  chills, fatigue and fever.  HENT: Positive for ear pain. Negative for facial swelling, hearing loss, sore throat and trouble swallowing.        Foreign body left ear canal  Respiratory: Negative for cough and shortness of breath.   Musculoskeletal: Negative for myalgias, neck pain and neck stiffness.  Skin: Negative for rash.  Neurological: Negative for dizziness, weakness, numbness and headaches.  Hematological: Does not bruise/bleed easily.    Physical Exam Updated Vital Signs BP 121/63 (BP Location: Right Arm)   Pulse 69   Temp (!) 97.5 F (36.4 C) (Temporal)   Resp 14   Ht 5\' 11"  (1.803 m)   Wt 77.1 kg   SpO2 99%   BMI 23.71 kg/m   Physical Exam Vitals and nursing note reviewed.  Constitutional:      Appearance: Normal appearance.  HENT:     Right Ear: Tympanic membrane and ear canal normal.     Ears:     Comments: Foreign body left ear canal, no edema or bleeding.  Unable to visualize TM.      Mouth/Throat:     Mouth: Mucous membranes are moist.  Cardiovascular:     Rate and Rhythm: Normal rate and regular rhythm.     Pulses: Normal pulses.  Musculoskeletal:     Cervical back: Normal range of motion.  Skin:    General: Skin is  warm.  Neurological:     General: No focal deficit present.     Mental Status: He is alert.     Sensory: No sensory deficit.     Motor: No weakness.     ED Results / Procedures / Treatments   Labs (all labs ordered are listed, but only abnormal results are displayed) Labs Reviewed - No data to display  EKG None  Radiology No results found.  Procedures .Foreign Body Removal  Date/Time: 01/21/2020 6:55 PM Performed by: Pauline Aus, PA-C Authorized by: Pauline Aus, PA-C  Body area: ear Location details: left ear Anesthesia method: None.  Sedation: Patient sedated: no  Patient restrained: no Patient cooperative: yes Localization method: visualized Removal mechanism: alligator forceps Complexity: simple 1  objects recovered. Objects recovered:  piece of soft plastic Post-procedure assessment: foreign body removed Patient tolerance: patient tolerated the procedure well with no immediate complications Comments: Soft tip from earbud was removed from the left ear canal without difficulty.  Patient tolerated well.  On repeat otoscopic exam of the left ear, the TM is intact.  No edema or bleeding of the canal.   (including critical care time)  Medications Ordered in ED Medications - No data to display  ED Course  I have reviewed the triage vital signs and the nursing notes.  Pertinent labs & imaging results that were available during my care of the patient were reviewed by me and considered in my medical decision making (see chart for details).    MDM Rules/Calculators/A&P                          Patient with foreign body in the left ear canal.  Removed completely and without difficulty.  TM intact.   Final Clinical Impression(s) / ED Diagnoses Final diagnoses:  Foreign body of left ear, initial encounter    Rx / DC Orders ED Discharge Orders    None       Rosey Bath 01/21/20 1859    Raeford Razor, MD 01/25/20 2236

## 2020-01-21 NOTE — Discharge Instructions (Addendum)
Return if needed
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
